# Patient Record
Sex: Male | Born: 1985 | Race: Asian | Hispanic: No | Marital: Married | Smoking: Never smoker
Health system: Southern US, Community
[De-identification: ages and names within clinical notes are randomized; demographics above are authoritative.]

---

## 2020-08-21 DIAGNOSIS — Z0289 Encounter for other administrative examinations: Secondary | ICD-10-CM | POA: Insufficient documentation

## 2020-08-25 ENCOUNTER — Other Ambulatory Visit: Payer: Self-pay

## 2020-08-25 ENCOUNTER — Other Ambulatory Visit (HOSPITAL_COMMUNITY)
Admission: RE | Admit: 2020-08-25 | Discharge: 2020-08-25 | Disposition: A | Discharge status: Home / Self Care | Payer: Medicaid Other | Source: Ambulatory Visit | Attending: Family Medicine | Admitting: Family Medicine

## 2020-08-25 ENCOUNTER — Ambulatory Visit: Payer: Medicaid Other | Admitting: Family Medicine

## 2020-08-25 DIAGNOSIS — Z0289 Encounter for other administrative examinations: Secondary | ICD-10-CM

## 2020-08-25 DIAGNOSIS — R350 Frequency of micturition: Secondary | ICD-10-CM | POA: Insufficient documentation

## 2020-08-25 LAB — POCT URINALYSIS DIP (MANUAL ENTRY)
Bilirubin, UA: NEGATIVE
Blood, UA: NEGATIVE
Glucose, UA: NEGATIVE mg/dL
Ketones, POC UA: NEGATIVE mg/dL
Leukocytes, UA: NEGATIVE
Nitrite, UA: NEGATIVE
Protein Ur, POC: NEGATIVE mg/dL
Spec Grav, UA: 1.025 (ref 1.010–1.025)
Urobilinogen, UA: 2 E.U./dL — AB
pH, UA: 7.5 (ref 5.0–8.0)

## 2020-08-25 LAB — POCT GLYCOSYLATED HEMOGLOBIN (HGB A1C): Hemoglobin A1C: 4.4 % (ref 4.0–5.6)

## 2020-08-25 MED ORDER — ALBENDAZOLE 200 MG PO TABS: 400.0000 mg | ORAL_TABLET | Freq: Once | ORAL | 0 refills | Status: AC

## 2020-08-25 MED ORDER — IVERMECTIN 3 MG PO TABS: 18.0000 mg | ORAL_TABLET | Freq: Every day | ORAL | 0 refills | Status: AC
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
## 2020-08-25 NOTE — Patient Instructions (Addendum)
It was wonderful to see you today.  Please bring ALL of your medications with you to every visit.   Today we talked about: Follow up appointment scheduled  Ivermectin and albendazole given for presumptive parasite treatment  Thank you for choosing [facility].   Please call [phone] with any questions about today's appointment.  Please be sure to schedule follow up at the front  desk before you leave today.   [name], MD  Family Medicine

## 2020-08-25 NOTE — Assessment & Plan Note (Addendum)
-   Plan for follow-up with health department, normal CXR at [facility] base screening  - Ivermectin and albendazole prescribe as presumptive parasite treatment per recommendations by CDC -Follow-up ordered blood tests and urine tests -He has plan follow-up in 1 month to address any additional chronic issues that cannot be addressed today.

## 2020-08-25 NOTE — Assessment & Plan Note (Signed)
He notes that he sometimes has urinary frequency but he thinks this may be related to difficulty finding the bathroom in a new country with a language that he is less familiar with.  He denies dysuria and lower abdominal pain and fevers.  UA shows no evidence of nitrites or leukocytes. -We will discuss further at his follow-up visit in 1 month -No additional lab testing at this time

## 2020-08-26 ENCOUNTER — Encounter: Payer: Self-pay | Admitting: Family Medicine

## 2020-08-26 LAB — LIPID PANEL
Chol/HDL Ratio: 2.9 ratio (ref 0.0–5.0)
Cholesterol, Total: 158 mg/dL (ref 100–199)
HDL: 54 mg/dL (ref >39)
LDL Chol Calc (NIH): 90 mg/dL (ref 0–99)
Triglycerides: 73 mg/dL (ref 0–149)
VLDL Cholesterol Cal: 14 mg/dL (ref 5–40)

## 2020-08-26 LAB — CBC WITH DIFFERENTIAL/PLATELET
Basophils Absolute: 0.1 10*3/uL (ref 0.0–0.2)
Basos: 1 %
EOS (ABSOLUTE): 0.1 10*3/uL (ref 0.0–0.4)
Eos: 3 %
Hematocrit: 45.4 % (ref 37.5–51.0)
Hemoglobin: 15.8 g/dL (ref 13.0–17.7)
Immature Grans (Abs): 0 10*3/uL (ref 0.0–0.1)
Immature Granulocytes: 0 %
Lymphocytes Absolute: 1.8 10*3/uL (ref 0.7–3.1)
Lymphs: 38 %
MCH: 31.2 pg (ref 26.6–33.0)
MCHC: 34.8 g/dL (ref 31.5–35.7)
MCV: 90 fL (ref 79–97)
Monocytes Absolute: 0.3 10*3/uL (ref 0.1–0.9)
Monocytes: 7 %
Neutrophils Absolute: 2.3 10*3/uL (ref 1.4–7.0)
Neutrophils: 51 %
Platelets: 99 10*3/uL — CL (ref 150–450)
RBC: 5.07 x10E6/uL (ref 4.14–5.80)
RDW: 11.4 % — ABNORMAL LOW (ref 11.6–15.4)
WBC: 4.6 10*3/uL (ref 3.4–10.8)

## 2020-08-26 LAB — COMPREHENSIVE METABOLIC PANEL
ALT: 45 IU/L — ABNORMAL HIGH (ref 0–44)
AST: 33 IU/L (ref 0–40)
Albumin/Globulin Ratio: 1.9 (ref 1.2–2.2)
Albumin: 4.4 g/dL (ref 4.0–5.0)
Alkaline Phosphatase: 69 IU/L (ref 44–121)
BUN/Creatinine Ratio: 17 (ref 9–20)
BUN: 14 mg/dL (ref 6–20)
Bilirubin Total: 1.6 mg/dL — ABNORMAL HIGH (ref 0.0–1.2)
CO2: 25 mmol/L (ref 20–29)
Calcium: 9.1 mg/dL (ref 8.7–10.2)
Chloride: 104 mmol/L (ref 96–106)
Creatinine, Ser: 0.81 mg/dL (ref 0.76–1.27)
GFR calc Af Amer: 135 mL/min/{1.73_m2} (ref >59)
GFR calc non Af Amer: 117 mL/min/{1.73_m2} (ref >59)
Globulin, Total: 2.3 g/dL (ref 1.5–4.5)
Glucose: 131 mg/dL — ABNORMAL HIGH (ref 65–99)
Potassium: 3.8 mmol/L (ref 3.5–5.2)
Sodium: 140 mmol/L (ref 134–144)
Total Protein: 6.7 g/dL (ref 6.0–8.5)

## 2020-08-26 LAB — RPR: RPR Ser Ql: NONREACTIVE

## 2020-08-26 LAB — HEPATITIS B SURFACE ANTIBODY, QUANTITATIVE: Hepatitis B Surf Ab Quant: <3.1 m[IU]/mL — ABNORMAL LOW (ref >9.9)

## 2020-08-26 LAB — HEPATITIS B CORE ANTIBODY, TOTAL: Hep B Core Total Ab: POSITIVE — AB

## 2020-08-26 LAB — HIV ANTIBODY (ROUTINE TESTING W REFLEX): HIV Screen 4th Generation wRfx: NONREACTIVE

## 2020-08-26 LAB — VARICELLA ZOSTER ANTIBODY, IGG: Varicella zoster IgG: 589 index (ref >165)

## 2020-08-26 LAB — HCV INTERPRETATION

## 2020-08-26 LAB — TSH: TSH: 1.22 u[IU]/mL (ref 0.450–4.500)

## 2020-08-26 LAB — HEPATITIS B SURFACE ANTIGEN: Hepatitis B Surface Ag: POSITIVE — AB

## 2020-08-26 LAB — HCV AB W REFLEX TO QUANT PCR: HCV Ab: <0.1 s/co ratio (ref 0.0–0.9)

## 2020-08-27 LAB — URINE CYTOLOGY ANCILLARY ONLY
Chlamydia: NEGATIVE
Comment: NEGATIVE
Comment: NORMAL
Neisseria Gonorrhea: NEGATIVE

## 2020-09-01 ENCOUNTER — Telehealth: Payer: Self-pay | Admitting: Family Medicine

## 2020-09-01 ENCOUNTER — Encounter: Payer: Self-pay | Admitting: Family Medicine

## 2020-09-01 DIAGNOSIS — D696 Thrombocytopenia, unspecified: Secondary | ICD-10-CM

## 2020-09-01 DIAGNOSIS — B181 Chronic viral hepatitis B without delta-agent: Secondary | ICD-10-CM

## 2020-09-01 NOTE — Telephone Encounter (Signed)
Using interpretor phone service with Pashto interpretor, attempted to call patient x2 but VM not set up and unable to leave message.  DPR signed in clinic and thus message sent to CM to let patient know about appt scheduled for 09/02/20 at 3:30 pm with Dr Dareen Piano.  Pertinent Results from Initial Refugee visit: - Hep B Core Total Ab positive, Hep B surface Ag positive, Hep B surface Ab < 3.1 = consistent with chronic hepatitis B infection - Thrombocytopenia platelets 99  Plan for patient at next visit: - Discuss diagnosis of chronic hepatitis B- unable to reach patient and he has not been told yet, recommend other members of his household be tested and vaccinated for hep B - Further counseling: not sharing toothbrush or razors, cover open cuts/scratches, clean blood spills with detergent/bleach, use barrier protection during sexual intercourse if partner not vaccinated or naturally immune, advise abstinence from alcohol - For his chronic Hep B further lab work includes- HBV DNA, Hep B E Antigen, Hep B E antibody, Hep A antibody total, PT/INR - For his thrombocytopenia- repeat CBC with peripheral smear - Order and scheduled RUQ ultrasound at patient visit and give location/time of appointment to patient before leaving as he has been unable to be reached by phone - clarify patient phone number and if there is a better number to reach him - referral to ID has been placed, if possible to see if appointment can be scheduled while he is in office so he knows when to follow-up, if not then plan to let him know about appointment at his next follow-up with Dr Homero Fellers or can message through his Case manager  Burley Saver MD

## 2020-09-02 ENCOUNTER — Encounter: Payer: Self-pay | Admitting: Family Medicine

## 2020-09-02 ENCOUNTER — Ambulatory Visit (INDEPENDENT_AMBULATORY_CARE_PROVIDER_SITE_OTHER): Payer: Medicaid Other | Admitting: Family Medicine

## 2020-09-02 ENCOUNTER — Other Ambulatory Visit: Payer: Self-pay

## 2020-09-02 VITALS — BP 124/86 | HR 78 | Resp 16 | Ht 68.0 in | Wt 212.8 lb

## 2020-09-02 DIAGNOSIS — B181 Chronic viral hepatitis B without delta-agent: Secondary | ICD-10-CM | POA: Diagnosis present

## 2020-09-02 DIAGNOSIS — D696 Thrombocytopenia, unspecified: Secondary | ICD-10-CM

## 2020-09-02 NOTE — Progress Notes (Signed)
    SUBJECTIVE:   CHIEF COMPLAINT / HPI:    Chronic hepatitis B: Patient is a very pleasant 34 year old patch to speaking male following up today for his most recent initial refugee visit in which it was noted that the patient has chronic hepatitis B.  At this appointment hepatitis B core total antibody was positive, hepatitis B surface antigen was positive, hepatitis B surface antibody less than 3.1.  Platelets were noted to be 99.  Patient was discussed with me by patient's PCP Dr. [name] as well as by attending Dr. Mikey Bussing.  Concerns and task for this appointment: 1.  Discussed diagnosis of chronic hepatitis B: Patient was educated about hepatitis B being a virus that is very contagious and can potentially damage the liver. 2.  Further counseling on chronic hepatitis B: Patient instructed not to share toothbrushes, razors with others.  Was instructed to cover any open cuts/scratches, to clean any blood spills with detergent/bleach, use barrier protection during sexual intercourse if partner is not vaccinated or naturally immune, also advised to abstain from drinking alcohol. 3.  Contact information/phone number: The patient was instructed that we may try to call him with the help of an interpreter.  His phone number in the chart [phone] was verified as correct with the patient.  The interpreter in the room helped the patient to set up his voicemail.  The patient also remarked that we could reach out to his sponsor Eliott Nine at [phone] should we need to contact someone. 4.  Lab work: Lab work ordered at [employer] visit includes HBV DNA, hep B E antigen, hep B E antibody, hep A antibody total, PT/INR, CBC and peripheral smear 5.  Imaging: Right upper quadrant ultrasound was ordered for patient at this visit and scheduled for him 6.  Referral to infectious disease: Was placed during this office visit by contacting ID office, patient scheduled for follow-up with infectious disease  09/08/2020.  Interpreter will be available. 7.  Patient also scheduled for follow-up visit with his PCP Dr. [name] on 09/29/2020  PERTINENT  PMH / PSH:  Patient Active Problem List   Diagnosis Date Noted  . Chronic hepatitis B (HCC) 09/01/2020  . Thrombocytopenia (HCC) 09/01/2020  . Urinary frequency 08/25/2020  . Encounter for health examination of refugee 08/21/2020     OBJECTIVE:   BP 124/86   Pulse 78   Resp 16   Ht 5\' 8"  (1.727 m)   Wt 212 lb 12.8 oz (96.5 kg)   SpO2 97%   BMI 32.36 kg/m    Physical exam: General: Well-appearing patient, nontoxic-appearing Respiratory: Comfortable work of breathing, speaking complete sentences  ASSESSMENT/PLAN:   Thrombocytopenia (HCC) -Follow-up with CBC -Peripheral smear ordered  Chronic hepatitis B (HCC) 1.  Additional labs ordered to further evaluate patient's chronic hepatitis B 2.  Patient was educated on what hepatitis B is, how it is spread, and how to prevent spread 3.  Patient instructed to have his roommate/housemates evaluated and tested for hepatitis B 4.  Imaging of right upper quadrant ultrasound ordered for patient 5.  Infectious disease follow-up appointment made 6.  Patient to follow-up with PCP September 29, 2020     October 01, 2020, DO [facility]

## 2020-09-02 NOTE — Assessment & Plan Note (Signed)
-  Follow-up with CBC -Peripheral smear ordered

## 2020-09-02 NOTE — Assessment & Plan Note (Signed)
1.  Additional labs ordered to further evaluate patient's chronic hepatitis B 2.  Patient was educated on what hepatitis B is, how it is spread, and how to prevent spread 3.  Patient instructed to have his roommate/housemates evaluated and tested for hepatitis B 4.  Imaging of right upper quadrant ultrasound ordered for patient 5.  Infectious disease follow-up appointment made 6.  Patient to follow-up with PCP September 29, 2020

## 2020-09-02 NOTE — Patient Instructions (Addendum)
Thank you for coming in to see [place] today! Please see below to review our plan for today's visit:  1. Do not share not toothbrush or razors, cover open cuts/scratches, clean blood spills with detergent/bleach, use barrier protection during sexual intercourse if partner not vaccinated or naturally immune, advise abstinence from alcohol. 2. We are sending you to get an Ultrasound of your Abdomen 3. Please follow up with Infectious Disease on December 21 at 9:30am - [facility] [address].    Please call the clinic at [phone] if your symptoms worsen or you have any concerns. It was our pleasure to serve you!     Dr. Peggyann Shoals [facility]

## 2020-09-03 LAB — HEPATITIS B DNA, ULTRAQUANTITATIVE, PCR

## 2020-09-03 LAB — HEPATITIS B E ANTIBODY: Hep B E Ab: NEGATIVE

## 2020-09-03 LAB — HEPATITIS B E ANTIGEN: Hep B E Ag: POSITIVE — AB

## 2020-09-03 LAB — CBC
Hematocrit: 45.5 % (ref 37.5–51.0)
Hemoglobin: 16 g/dL (ref 13.0–17.7)
MCH: 31.8 pg (ref 26.6–33.0)
MCHC: 35.2 g/dL (ref 31.5–35.7)
MCV: 91 fL (ref 79–97)
Platelets: 109 10*3/uL — ABNORMAL LOW (ref 150–450)
RBC: 5.03 x10E6/uL (ref 4.14–5.80)
RDW: 11.8 % (ref 11.6–15.4)
WBC: 6 10*3/uL (ref 3.4–10.8)

## 2020-09-03 LAB — PROTIME-INR
INR: 1.2 (ref 0.9–1.2)
Prothrombin Time: 12.3 s — ABNORMAL HIGH (ref 9.1–12.0)

## 2020-09-03 LAB — HBV REAL-TIME PCR, QUANT
HBV AS IU/ML: 12600000 IU/mL
LOG10 HBV AS IU/ML: 7.1 log10 IU/mL

## 2020-09-03 LAB — HEPATITIS A ANTIBODY, TOTAL: hep A Total Ab: POSITIVE — AB

## 2020-09-04 ENCOUNTER — Telehealth: Payer: Self-pay | Admitting: *Deleted

## 2020-09-04 NOTE — Telephone Encounter (Signed)
-----   Message from Billey Co, MD sent at 09/01/2020 11:58 AM EST ----- Regarding: ID referral Hi team,  I saw the message about Jazmin being out, I placed a referral to ID today for this patient, he has an appointment with Dr Dareen Piano tomorrow to follow up chronic hep B. Just wanted to make y'all aware of referral being placed so we can get him scheduled for follow up with ID. If possible would be nice to have appt scheduled before he leaves tomorrow just so we can tell him date/time since I have been unable to reach him by phone, but no worries if not I can send this information to his case manager.  Thanks for all you do! [employer]

## 2020-09-04 NOTE — Telephone Encounter (Signed)
THis appointment has been scheduled.Kaylia Winborne Zimmerman Rumple, CMA  

## 2020-09-04 NOTE — Telephone Encounter (Signed)
Awesome! Thank you!

## 2020-09-05 LAB — PATHOLOGIST SMEAR REVIEW
Basophils Absolute: 0.1 10*3/uL (ref 0.0–0.2)
Basos: 1 %
EOS (ABSOLUTE): 0.1 10*3/uL (ref 0.0–0.4)
Eos: 2 %
Hematocrit: 45.7 % (ref 37.5–51.0)
Hemoglobin: 15.9 g/dL (ref 13.0–17.7)
Immature Grans (Abs): 0 10*3/uL (ref 0.0–0.1)
Immature Granulocytes: 0 %
Lymphocytes Absolute: 1.9 10*3/uL (ref 0.7–3.1)
Lymphs: 34 %
MCH: 31.2 pg (ref 26.6–33.0)
MCHC: 34.8 g/dL (ref 31.5–35.7)
MCV: 90 fL (ref 79–97)
Monocytes Absolute: 0.4 10*3/uL (ref 0.1–0.9)
Monocytes: 7 %
Neutrophils Absolute: 3.2 10*3/uL (ref 1.4–7.0)
Neutrophils: 56 %
Platelets: 110 10*3/uL — ABNORMAL LOW (ref 150–450)
RBC: 5.1 x10E6/uL (ref 4.14–5.80)
RDW: 11.5 % — ABNORMAL LOW (ref 11.6–15.4)
WBC: 5.8 10*3/uL (ref 3.4–10.8)

## 2020-09-08 ENCOUNTER — Ambulatory Visit: Payer: Medicaid Other | Admitting: Internal Medicine

## 2020-09-25 ENCOUNTER — Other Ambulatory Visit: Payer: Medicaid Other

## 2020-09-29 ENCOUNTER — Other Ambulatory Visit: Payer: Self-pay

## 2020-09-29 ENCOUNTER — Ambulatory Visit (INDEPENDENT_AMBULATORY_CARE_PROVIDER_SITE_OTHER): Payer: Medicaid Other | Admitting: Internal Medicine

## 2020-09-29 ENCOUNTER — Ambulatory Visit: Payer: Medicaid Other | Admitting: Family Medicine

## 2020-09-29 ENCOUNTER — Encounter: Payer: Self-pay | Admitting: Family Medicine

## 2020-09-29 VITALS — Ht 68.0 in | Wt 212.0 lb

## 2020-09-29 DIAGNOSIS — D6949 Other primary thrombocytopenia: Secondary | ICD-10-CM

## 2020-09-29 DIAGNOSIS — B181 Chronic viral hepatitis B without delta-agent: Secondary | ICD-10-CM | POA: Diagnosis not present

## 2020-09-29 DIAGNOSIS — Z205 Contact with and (suspected) exposure to viral hepatitis: Secondary | ICD-10-CM | POA: Diagnosis not present

## 2020-09-29 DIAGNOSIS — K219 Gastro-esophageal reflux disease without esophagitis: Secondary | ICD-10-CM

## 2020-09-29 DIAGNOSIS — D696 Thrombocytopenia, unspecified: Secondary | ICD-10-CM | POA: Diagnosis not present

## 2020-09-29 MED ORDER — PANTOPRAZOLE SODIUM 20 MG PO TBEC: 20.0000 mg | DELAYED_RELEASE_TABLET | Freq: Every day | ORAL | 3 refills | Status: DC

## 2020-09-29 MED ORDER — FAMOTIDINE 20 MG PO TABS: 20.0000 mg | ORAL_TABLET | Freq: Two times a day (BID) | ORAL | 1 refills | Status: AC

## 2020-09-29 NOTE — Progress Notes (Signed)
[facility]  Reason for Consult:hepatitis b Referring Provider: [name], md    Patient Active Problem List   Diagnosis Date Noted  . Chronic hepatitis B (HCC) 09/01/2020  . Thrombocytopenia (HCC) 09/01/2020  . Urinary frequency 08/25/2020  . Encounter for health examination of refugee 08/21/2020      HPI: Johnathan Harris is a 35 y.o. male with GERD referred here for chronic hep B tx, from his pcp  Interpreter present   Chronic hep B history: He remembers his eyes turning yellow 2 years ago when he was in [place]. He remembers at that time his kids got some kind of vaccine. He was given some kind of medication He never new he has chronic hepatitis b Review of his labs; hep c ab and hep a ab also positive There was an ultrasound of his liver He doesn't know anyone he has lived with that has chronic hepatitis b   Extra GI manifestation & Cirrhosis s/s: No dm2/htn/hlp Patient feels he is in good health No chronic joint pain, fatigue, weight loss, fatigue No hx abd girth increase/paracentesis, vomiting up blood/black-bloody stool Weight increasing No rash  Blood borne pathogen risk factors: Denies ivdu, tatoos No hx blood transfusion Monogamous relationship with wife for 15 years  Social: Patient immigrated to the [place] in 5 months; he plans to stay in [place] for a long time. He came from [place] Patient is not here with his family. His wife is currently in [place]; he has 6 kids No smoking, drinking, street drugs   He needs a letter for being off work today He would like a prescription for her protonix   Review of Systems: ROS Other ROS negative      PMH: #gerd  Fam hx: No history liver cancer   Social History   Tobacco Use  . Smoking status: Never Smoker  . Smokeless tobacco: Never Used    No family history on file.  No Known Allergies  OBJECTIVE: There were no vitals filed for this  visit. There is no height or weight on file to calculate BMI.   Physical Exam Well appearing, obese, no distress Heent: per, conj clear, eomi, oropharynx clear Neck supple cv rrr no mrg Lungs clear abd obese/soft-nt Ext no edema Skin no rash msk no back/cva tenderness Neuro cn2-12 intact; strength/reflex intact; nonfocal Psych alert/oriented  Lab: 09/02/20 cbc 6/16/110; cr 0.8; lft 33/45/69/1.6; alb 4.4  Microbiology:  Serology: 08/2020 rpr nr, hiv nr, gc/chlam negative 08/2020 hep B sAg positive, hep b eAg positive, hep B eAb negative, hep B sAb negative, hep B cAb positive 08/2020 hep A ab positive, hep C ab positive  Imaging: none  Assessment/plan: 35 yo [place] immigrant of 5 months prior to this 09/2020 visit referred  Here for chronic hep b evaluation and treatment  #hep b; e-ag positive -I suspect this is more likely perinatal transmission but certainly sexual promiscuity is a possibility. I didn't dwell into too much with latter risk factor out of hesitation of cultural difference. He also has hepatitis c exposure as well. hiv is negative.  -I am unclear about duration of hep b... there is mild thrombocytopenia but no other sign of cirrhosis otherwise on exam/history. Could be direct hepatitis effect vs other such as primary hematologic process, or given immigrant from [place] could be other granulomatous process. He doesn't drink alcohol. protonix should be low risk for inducing thrombocytopenia -only mild lft elevation with ALT  45. vl high. Would consider this immune tolerance. He is young. No family hx HCC. dont' think he needs treatment at this time per guideline  -repeat hepatitis b full serology in 3 months -liver ultrasound complete -work letter  -discuss healthy lifestyle and avoid risk factors contributing to hepatitis/cirrhosis such as tylenol use/etoh/weight-dm   #other hepatitis -will check hep c pcr as well  #gerd -provide new rx  protonix  #thrombocytopenia -repeat testing 3 months; see ddx above -if persistently low and no evidence cirrhosis will refer to hematology -based on liver ultrasound (and will consider abd/pelv ct for lymphadenopathy/granulomatous change in liver/spleen) will consider sendign serology for other chronic granulomatous infection as well    Problem List Items Addressed This Visit   None   Visit Diagnoses    Chronic viral hepatitis B without delta agent and without coma (HCC)    -  Primary   Relevant Orders   Protime-INR ( [facility] ONLY)   CBC   Lipid panel   AFP tumor marker   Comprehensive metabolic panel   Hepatitis delta virus antigen   Hepatitis delta antibody   Hepatitis C RNA quantitative ([facility])   Hepatitis B e antibody   Hepatitis B e antigen   Hepatitis B surface antibody,quantitative   Hepatitis B Surface AntiGEN   Hepatitis B DNA, ultraquantitative, PCR   US Abdomen Complete   Exposure to hepatitis C       Gastroesophageal reflux disease without esophagitis       Relevant Medications   pantoprazole (PROTONIX) 20 MG tablet       Torell Kellen does not currently have medications on file.     Follow-up: No follow-ups on file.  Raymondo Band, MD [facility] -- -- pager   [phone] cell 09/29/2020, 8:45 AM

## 2020-09-29 NOTE — Assessment & Plan Note (Signed)
Labs consistent with ongoing hepatitis B infection.  Liver ultrasound shows no evidence of masses. -No medication needed at this time -We discussed how he has the potential to clear this infection on his own without medication because he is young and healthy. -Continue to follow with infectious disease

## 2020-09-29 NOTE — Patient Instructions (Addendum)
Hepatitis B: You do not need any specific treatment at this time.  Please follow-up with infectious disease for your next appointment to see if your body has gotten rid of the infection.  Stomach pain: Please do not take pantoprazole or omeprazole.  If you have stomach pain, please use famotidine.  I have sent a prescription of this medicine to your pharmacy.  Please come back to clinic in 1 month and we will test you for an infection called H. pylori which can lead to stomach discomfort.  Please avoid using famotidine 2 days before your appointment.  Polio vaccine: Please follow-up with the health department to discuss a polio vaccine.    ~? ~? ???? ?  ?? .  ~?   ~ ??    ?  ? ~? ? ? ?    ?~ ?  ? ?.   ? :  ~? pantoprazole ? omeprazole  . ~   ?    ~? famotidine ~.   ?      ??? .  ~?  ? ? ~? ? ~??~    ?    H. pylori    ?~  ? ~? ?  ?  ~?  ~?? .  ~?    ?  ??   ???? ~ ? ?? ~?.   ? ~?:  ~?  ? ~?  ?     ? ?  ? ~?.

## 2020-09-29 NOTE — Assessment & Plan Note (Signed)
-  Continue to monitor, along with hepatitis B infection.  If this does not improve with his hepatitis B infection, a referral to hematology would be beneficial.

## 2020-09-29 NOTE — Patient Instructions (Addendum)
You likely have chronic hepatitis B  This will put you at risk for liver cancer (can develop anytime), and cirrhosis (or failing of your liver) after 20-30 years  You also have exposure to chronic hepatitis C   There will be more blood tests in the next several months. We can do them 6 months from now  You'll also need to have periodic liver ultrasound to make sure you don't develop cancer  If your family members dont' have hepatitis B (as you confirm) I recommend they get hepatitis b vaccine   To avoid other risk for getting poor liver health Avoid alcohol Get regular exercise Eat good balanced diet   Please follow up with [place] in 3 months; do blood tests 2 weeks before that to follow up on your hepatitis b and C

## 2020-09-29 NOTE — Assessment & Plan Note (Signed)
Symptoms are consistent with GERD.  He does have significant risk for H. pylori.  We will forego the breath test today based on his recent use of omeprazole.  We will plan for a breath test when he returns to clinic in 1 month. -Do not use pantoprazole or omeprazole -Use famotidine 20 mg twice daily as needed for the next month.  Discontinue famotidine 2 days prior to next visit. -Plan for urea breath test next visit.

## 2020-09-29 NOTE — Progress Notes (Signed)
    SUBJECTIVE:   CHIEF COMPLAINT / HPI:   Hepatitis B infection, active Johnathan Harris was seen by infectious disease earlier today.  He was told that his labs are consistent with an ongoing hepatitis B infection but no treatment is necessary at this time.  He did have questions about how this could be possible-how he could be sick yet not require treatment.  GERD He reports occasional stomach discomfort which improves with PPI.  While he was in [place], he was prescribed pantoprazole by a physician which significantly improved his abdominal pain.  He notes that he did have a recurrence of his abdominal pain 2 days ago and took over-the-counter omeprazole which he found helpful.  He was also given pantoprazole earlier today by his infectious disease doctor.  PERTINENT  PMH / PSH: Ongoing hepatitis B infection as above  OBJECTIVE:   BP 118/74   Pulse 83   Ht 5\' 8"  (1.727 m)   Wt 215 lb 6.4 oz (97.7 kg)   SpO2 98%   BMI 32.75 kg/m    General: Alert and cooperative and appears to be in no acute distress Cardio: Normal S1 and S2, no S3 or S4. Rhythm is regular. No murmurs or rubs.   Pulm: Clear to auscultation bilaterally, no crackles, wheezing, or diminished breath sounds. Normal respiratory effort Abdomen: Bowel sounds normal. Abdomen soft and non-tender.  No palpable hepatosplenomegaly.   ASSESSMENT/PLAN:   Chronic hepatitis B (Johnathan Harris) Labs consistent with ongoing hepatitis B infection.  Liver ultrasound shows no evidence of masses. -No medication needed at this time -We discussed how he has the potential to clear this infection on his own without medication because he is young and healthy. -Continue to follow with infectious disease  Thrombocytopenia (Johnathan Harris) -Continue to monitor, along with hepatitis B infection.  If this does not improve with his hepatitis B infection, a referral to hematology would be beneficial.  Chronic GERD Symptoms are consistent with GERD.  He does have  significant risk for H. pylori.  We will forego the breath test today based on his recent use of omeprazole.  We will plan for a breath test when he returns to clinic in 1 month. -Do not use pantoprazole or omeprazole -Use famotidine 20 mg twice daily as needed for the next month.  Discontinue famotidine 2 days prior to next visit. -Plan for urea breath test next visit.   Healthcare maintenance -Due for polio vaccination, encouraged to follow-up with the health department.  , MD [facility]

## 2020-10-09 ENCOUNTER — Ambulatory Visit: Payer: Medicaid Other | Admitting: Family Medicine

## 2020-10-12 ENCOUNTER — Ambulatory Visit
Admission: RE | Admit: 2020-10-12 | Discharge: 2020-10-12 | Disposition: A | Discharge status: Home / Self Care | Payer: Medicaid Other | Source: Ambulatory Visit | Attending: Family Medicine | Admitting: Family Medicine

## 2020-10-12 DIAGNOSIS — B181 Chronic viral hepatitis B without delta-agent: Secondary | ICD-10-CM

## 2020-11-02 ENCOUNTER — Ambulatory Visit: Payer: Medicaid Other | Admitting: Family Medicine

## 2020-12-10 ENCOUNTER — Other Ambulatory Visit: Payer: Self-pay

## 2020-12-10 ENCOUNTER — Ambulatory Visit: Payer: Medicaid Other | Admitting: Family Medicine

## 2020-12-10 VITALS — BP 112/70 | HR 77 | Ht 68.0 in | Wt 208.5 lb

## 2020-12-10 DIAGNOSIS — K219 Gastro-esophageal reflux disease without esophagitis: Secondary | ICD-10-CM | POA: Diagnosis present

## 2020-12-10 NOTE — Progress Notes (Signed)
    SUBJECTIVE:   CHIEF COMPLAINT / HPI:   GERD Johnathan Harris was last seen in clinic about 2 months ago at which time he was told to use famotidine for stomach upset and return to clinic for an H. pylori breath test.  Since the last visit, he has been using famotidine which is controlled his GERD symptoms well.  He reports that he has not been taking famotidine for the past 2 days per my former instructions.  He has also been off of tonics.  PERTINENT  PMH / PSH: GERD, refugee  OBJECTIVE:   BP 112/70   Pulse 77   Ht 5\' 8"  (1.727 m)   Wt 208 lb 8 oz (94.6 kg)   SpO2 96%   BMI 31.70 kg/m    General: Alert and cooperative and appears to be in no acute distress Cardio: Normal S1 and S2, no S3 or S4. Rhythm is regular. No murmurs or rubs.   Pulm: Clear to auscultation bilaterally, no crackles, wheezing, or diminished breath sounds. Normal respiratory effort Abdomen: Bowel sounds normal. Abdomen soft and non-tender.  Extremities: No peripheral edema. Warm/ well perfused.  Strong radial pulses. Neuro: Cranial nerves grossly intact  ASSESSMENT/PLAN:   Chronic GERD -Follow-up urea breath test -Continue famotidine or Protonix as needed     , MD [facility]

## 2020-12-10 NOTE — Assessment & Plan Note (Signed)
-  Follow-up urea breath test -Continue famotidine or Protonix as needed

## 2020-12-11 ENCOUNTER — Other Ambulatory Visit: Payer: Medicaid Other

## 2020-12-11 DIAGNOSIS — B181 Chronic viral hepatitis B without delta-agent: Secondary | ICD-10-CM

## 2020-12-12 LAB — H. PYLORI BREATH TEST: H pylori Breath Test: POSITIVE — AB

## 2020-12-15 ENCOUNTER — Other Ambulatory Visit: Payer: Self-pay | Admitting: Family Medicine

## 2020-12-15 MED ORDER — PANTOPRAZOLE SODIUM 40 MG PO TBEC: 40.0000 mg | DELAYED_RELEASE_TABLET | Freq: Two times a day (BID) | ORAL | 0 refills | Status: DC

## 2020-12-15 MED ORDER — PYLERA 140-125-125 MG PO CAPS: 3.0000 | ORAL_CAPSULE | Freq: Three times a day (TID) | ORAL | 0 refills | Status: DC

## 2020-12-15 NOTE — Progress Notes (Signed)
Johnathan Harris and his sponsor, [name], were called individually to discuss his recent H. pylori results.  They were informed that he is positive for H. pylori and should be treated.  Quadruple therapy (including pantoprazole, bismuth, tetracycline and metronidazole) was sent to his pharmacy.  He was encouraged not to start taking the medication until we were able to see him in the clinic.  He was scheduled for an appointment on 4/13.

## 2020-12-17 LAB — CBC
HCT: 46.9 % (ref 38.5–50.0)
Hemoglobin: 15.7 g/dL (ref 13.2–17.1)
MCH: 30 pg (ref 27.0–33.0)
MCHC: 33.5 g/dL (ref 32.0–36.0)
MCV: 89.5 fL (ref 80.0–100.0)
MPV: 13.3 fL — ABNORMAL HIGH (ref 7.5–12.5)
Platelets: 106 10*3/uL — ABNORMAL LOW (ref 140–400)
RBC: 5.24 10*6/uL (ref 4.20–5.80)
RDW: 11.3 % (ref 11.0–15.0)
WBC: 4.2 10*3/uL (ref 3.8–10.8)

## 2020-12-17 LAB — COMPREHENSIVE METABOLIC PANEL
AG Ratio: 1.8 (calc) (ref 1.0–2.5)
ALT: 30 U/L (ref 9–46)
AST: 26 U/L (ref 10–40)
Albumin: 4.3 g/dL (ref 3.6–5.1)
Alkaline phosphatase (APISO): 70 U/L (ref 36–130)
BUN: 16 mg/dL (ref 7–25)
CO2: 24 mmol/L (ref 20–32)
Calcium: 9 mg/dL (ref 8.6–10.3)
Chloride: 106 mmol/L (ref 98–110)
Creat: 0.83 mg/dL (ref 0.60–1.35)
Globulin: 2.4 g/dL (calc) (ref 1.9–3.7)
Glucose, Bld: 93 mg/dL (ref 65–99)
Potassium: 4.3 mmol/L (ref 3.5–5.3)
Sodium: 140 mmol/L (ref 135–146)
Total Bilirubin: 2.1 mg/dL — ABNORMAL HIGH (ref 0.2–1.2)
Total Protein: 6.7 g/dL (ref 6.1–8.1)

## 2020-12-17 LAB — LIPID PANEL
Cholesterol: 149 mg/dL (ref <200)
HDL: 53 mg/dL (ref >=40)
LDL Cholesterol (Calc): 83 mg/dL (calc)
Non-HDL Cholesterol (Calc): 96 mg/dL (calc) (ref <130)
Total CHOL/HDL Ratio: 2.8 (calc) (ref <5.0)
Triglycerides: 54 mg/dL (ref <150)

## 2020-12-17 LAB — HEPATITIS C RNA QUANTITATIVE
HCV Quantitative Log: <1.18 log IU/mL
HCV RNA, PCR, QN: <15 IU/mL

## 2020-12-17 LAB — HEPATITIS B E ANTIGEN: Hep B E Ag: REACTIVE — AB

## 2020-12-17 LAB — HEPATITIS B DNA, ULTRAQUANTITATIVE, PCR
Hepatitis B DNA (Calc): 5.92 Log IU/mL — ABNORMAL HIGH
Hepatitis B DNA: 836000 IU/mL — ABNORMAL HIGH

## 2020-12-17 LAB — HEPATITIS DELTA VIRUS ANTIGEN: Hepatitis D Antigen: NOT DETECTED

## 2020-12-17 LAB — HEPATITIS B SURFACE ANTIBODY, QUANTITATIVE: Hep B S AB Quant (Post): <5 m[IU]/mL — ABNORMAL LOW (ref >=10)

## 2020-12-17 LAB — AFP TUMOR MARKER: AFP-Tumor Marker: 2.6 ng/mL (ref <6.1)

## 2020-12-17 LAB — HEPATITIS B E ANTIBODY: Hep B E Ab: NONREACTIVE

## 2020-12-17 LAB — HEPATITIS DELTA ANTIBODY: Hepatitis D Ab, Total: NEGATIVE

## 2020-12-17 LAB — HEPATITIS B SURFACE ANTIGEN: Hepatitis B Surface Ag: REACTIVE — AB

## 2020-12-25 ENCOUNTER — Encounter: Payer: Medicaid Other | Admitting: Internal Medicine

## 2020-12-25 ENCOUNTER — Other Ambulatory Visit: Payer: Self-pay

## 2020-12-25 ENCOUNTER — Encounter: Payer: Self-pay | Admitting: Internal Medicine

## 2020-12-25 ENCOUNTER — Ambulatory Visit (INDEPENDENT_AMBULATORY_CARE_PROVIDER_SITE_OTHER): Payer: Medicaid Other | Admitting: Internal Medicine

## 2020-12-25 VITALS — BP 120/82 | HR 69 | Temp 98.3°F | Wt 210.0 lb

## 2020-12-25 DIAGNOSIS — B181 Chronic viral hepatitis B without delta-agent: Secondary | ICD-10-CM

## 2020-12-25 DIAGNOSIS — D696 Thrombocytopenia, unspecified: Secondary | ICD-10-CM

## 2020-12-25 DIAGNOSIS — A048 Other specified bacterial intestinal infections: Secondary | ICD-10-CM

## 2020-12-25 NOTE — Patient Instructions (Signed)
Your chronic liver disease due to hepatitis b doesn't need treatment right now  We will need to see what your liver health is (elastography is scheduled and will help [place] answer this question)  You also have low platelet, which could be due to infection or related to your h-pylori or hepatitis or liver disease. So elastography above, and abdominal ct will help determine this   Please discuss with your PCP to refer you to hematology to evaluate for your low platelet as well   For your hep b, you'll need to see [place] in 06/2021. We are checking repeat blood tests each visit to see when we need to treat you (do blood tests in 05/2021)

## 2020-12-25 NOTE — Progress Notes (Signed)
[facility]  Reason for Consult:hepatitis b Referring Provider: [name], md    Patient Active Problem List   Diagnosis Date Noted  . Chronic GERD 09/29/2020  . Chronic hepatitis B (HCC) 09/01/2020  . Thrombocytopenia (HCC) 09/01/2020      HPI: Johnathan Harris is a 35 y.o. male with GERD referred here for chronic hep B tx, from his pcp  12/25/20 id f/u Interpreter present  Reviewed hepatitis labs with patient He saw his pcp and was tested for h-pylori which is positive. He'll follow up with his pcp to initiate treatment. He has epigastric discomfort so was tested.  No family history of stomach cancer.  No family history of advance liver disease, liver cancer, liver cirrhosis  Patient is without history of diabetes  Social -- married; children; no smoking; no etoh; no street drugs. Patient immigrated here from [place]    Background from 09/2020 visit -------------- Chronic hep B history: He remembers his eyes turning yellow 2 years ago when he was in [place]. He remembers at that time his kids got some kind of vaccine. He was given some kind of medication He never new he has chronic hepatitis b Review of his labs; hep c ab and hep a ab also positive There was an ultrasound of his liver He doesn't know anyone he has lived with that has chronic hepatitis b   Extra GI manifestation & Cirrhosis s/s: No dm2/htn/hlp Patient feels he is in good health No chronic joint pain, fatigue, weight loss, fatigue No hx abd girth increase/paracentesis, vomiting up blood/black-bloody stool Weight increasing No rash  Blood borne pathogen risk factors: Denies ivdu, tatoos No hx blood transfusion Monogamous relationship with wife for 15 years  Social: Patient immigrated to the [place] in 5 months; he plans to stay in [place] for a long time. He came from [place] Patient is not here with his family. His wife is currently in [place];  he has 6 kids No smoking, drinking, street drugs   He needs a letter for being off work today He would like a prescription for her protonix   Review of Systems: ROS All other ROS negative      PMH: #gerd #chronic hep b  Fam hx: No history liver cancer   Social History   Tobacco Use  . Smoking status: Never Smoker  . Smokeless tobacco: Never Used  Substance Use Topics  . Alcohol use: Never  . Drug use: Never    No family history on file.  No Known Allergies  OBJECTIVE: Vitals:   12/25/20 0902  BP: 120/82  Pulse: 69  Temp: 98.3 F (36.8 C)  TempSrc: Oral  SpO2: 97%  Weight: 95.3 kg   Body mass index is 31.93 kg/m.   Physical Exam General/constitutional: no distress, pleasant HEENT: Normocephalic, PER, Conj Clear, EOMI, Oropharynx clear Neck supple CV: rrr no mrg Lungs: clear to auscultation, normal respiratory effort Abd: Soft, Nontender Ext: no edema Skin: No Rash Neuro: nonfocal MSK: no peripheral joint swelling/tenderness/warmth; back spines nontender     Lab: 12/11/20 alt 30; bilirubin 2.1; cbc 4/16/106 09/02/20 cbc 6/16/110; cr 0.8; lft 33/45/69/1.6; alb 4.4  Microbiology:  Serology: 3/25 hep B sAg, eAg positive; sAb, eAb negative; hep b dna 836000 3/25 hep c ab negative 3/24 h-pylori breath test positive 08/2020 rpr nr, hiv nr, gc/chlam negative 08/2020 hep B sAg positive, hep b eAg positive, hep B eAb negative, hep B sAb negative, hep B  cAb positive 08/2020 hep A ab positive, hep C ab positive  Imaging: none  Assessment/plan: 35 yo [place] immigrant of 5 months prior to this 09/2020 visit referred  Here for chronic hep b evaluation and treatment  #hep b; e-ag positive Family/children tested and per patient were negative Hep c negative His liver function is showing improvement No family hx liver cancer/cirrhosis Will evaluate liver health and repeat lab test in 3-6 months   -repeat hepatitis b full serology in 3-6 months  then follow up -liver ultrasound elastography -abd-pelv ct with contrast -discuss proper hygiene, need for healthy lifestyle/diet, and need for avoiding alcohol -will need yearly liver u/s for screening   #thrombocytopenia persistant moderate decrease baseline seems to be 100s ddx hepatitis, liver disease, hpylori associated ITP vs others  -liver elastography and abd ct to assess for nodules/splenomegaly (granulomatous reticuloendothelial chronic infection-bone marrow involvement) -f/u pcp for h-pylori treatment   #h-pylori See above   Problem List Items Addressed This Visit      Digestive   Chronic hepatitis B (HCC)   Relevant Orders   CT ABDOMEN PELVIS W CONTRAST   [place] ELASTOGRAPHY LIVER   Hepatitis B e antibody   Hepatitis B e antigen   Hepatitis B surface antigen   Hepatitis B DNA, ultraquantitative, PCR   Hepatitis B surface antibody,qualitative   CBC w/Diff   Comprehensive metabolic panel     Other   Thrombocytopenia (HCC)   Relevant Orders   CT ABDOMEN PELVIS W CONTRAST   [place] ELASTOGRAPHY LIVER    Other Visit Diagnoses    H. pylori infection    -  Primary       I am having Johnathan Harris maintain his famotidine, Pylera, and pantoprazole.     Follow-up: Return in about 6 months (around 06/26/2021).  Raymondo Band, MD [facility] -- -- pager   [phone] cell 12/25/2020, 9:38 AM

## 2020-12-30 ENCOUNTER — Ambulatory Visit: Payer: Medicaid Other | Admitting: Family Medicine

## 2020-12-30 ENCOUNTER — Telehealth: Payer: Self-pay

## 2020-12-30 ENCOUNTER — Other Ambulatory Visit: Payer: Self-pay

## 2020-12-30 DIAGNOSIS — A048 Other specified bacterial intestinal infections: Secondary | ICD-10-CM | POA: Diagnosis not present

## 2020-12-30 MED ORDER — PANTOPRAZOLE SODIUM 40 MG PO TBEC
40.0000 mg | DELAYED_RELEASE_TABLET | Freq: Two times a day (BID) | ORAL | 0 refills | Status: DC
Start: 2020-12-30 — End: 2021-03-15

## 2020-12-30 MED ORDER — PYLERA 140-125-125 MG PO CAPS: 3.0000 | ORAL_CAPSULE | Freq: Three times a day (TID) | ORAL | 0 refills | Status: DC

## 2020-12-30 MED ORDER — PANTOPRAZOLE SODIUM 40 MG PO TBEC: 40.0000 mg | DELAYED_RELEASE_TABLET | Freq: Two times a day (BID) | ORAL | 0 refills | Status: DC

## 2020-12-30 NOTE — Patient Instructions (Addendum)
H. pylori infection: Please go and pick up the medicine for your H. pylori infection.  I will call you at the time scheduled below and we will talk about the appropriate way to take this medication.  It is okay if you do not start the medication until after Ramadan.    H. pylori ?~:  ~? ?     H. pylori ?~   .      ? ?~   ~? ? ~?  ?   ?  ? ? ?  ? ???.    ~   Z?  ?? ?  ?  ~?.

## 2020-12-30 NOTE — Telephone Encounter (Signed)
Sponsor calls nurse line reporting pharmacy can not fill prescriptions due to PCP not being a medicaid provider. Sponsor is asking me to resend under someone who is and to the [employer] on [address]. Will send under Dr. Carlena Bjornstad name.

## 2020-12-30 NOTE — Assessment & Plan Note (Addendum)
There is no urgent need to start H. pylori treatment at this time.  We will plan to start treatment following the completion of Ramadan. -H. pylori treatment resent to pharmacy -Phone call visit to set up for 4/27 to discuss the treatment regimen once he has the medication -The plan was discussed with Johnathan Harris and his sponsor, [name] who plans to be involved in the phone call on 4/27.

## 2020-12-30 NOTE — Progress Notes (Signed)
    SUBJECTIVE:   CHIEF COMPLAINT / HPI:   H. pylori infection The main purpose of this visit was to go over the appropriate medication for his H. pylori infection. He has not yet picked up his medication and does not have any medication with him today, he believes the prescription may have been canceled.  He currently fasts all day during the day due to Ramadan.  Ramadan will be over on 5/4 followed by several days of feasting.  He would prefer to start the medication after that.  PERTINENT  PMH / PSH: Chronic hepatitis B, H. pylori infection  OBJECTIVE:   BP 127/87   Pulse 61   Ht 5\' 8"  (1.727 m)   Wt 208 lb 6 oz (94.5 kg)   SpO2 99%   BMI 31.68 kg/m    General: Seated comfortably in his chair in the exam room.  No acute distress. Respiratory: Breathing comfortably on room air.  No respiratory distress Extremities: Warm, dry  ASSESSMENT/PLAN:   H. pylori infection There is no urgent need to start H. pylori treatment at this time.  We will plan to start treatment following the completion of Ramadan. -H. pylori treatment resent to pharmacy -Phone call visit to set up for 4/27 to discuss the treatment regimen once he has the medication -The plan was discussed with Mr. Hoon and his sponsor, Dallas Schimke who plans to be involved in the phone call on 4/27.   A [place] interpreter was used for the duration of the visit.  5/27, MD [facility]

## 2020-12-31 ENCOUNTER — Ambulatory Visit (HOSPITAL_COMMUNITY)
Admission: RE | Admit: 2020-12-31 | Discharge: 2020-12-31 | Disposition: A | Discharge status: Home / Self Care | Payer: Medicaid Other | Source: Ambulatory Visit | Attending: Internal Medicine | Admitting: Internal Medicine

## 2020-12-31 DIAGNOSIS — B181 Chronic viral hepatitis B without delta-agent: Secondary | ICD-10-CM | POA: Insufficient documentation

## 2020-12-31 DIAGNOSIS — D696 Thrombocytopenia, unspecified: Secondary | ICD-10-CM

## 2020-12-31 MED ORDER — IOHEXOL 300 MG/ML  SOLN
100.0000 mL | Freq: Once | INTRAMUSCULAR | Status: AC | PRN
  Administered 2020-12-31: 100 mL via INTRAVENOUS

## 2021-01-04 ENCOUNTER — Ambulatory Visit (HOSPITAL_COMMUNITY): Payer: Medicaid Other

## 2021-01-13 ENCOUNTER — Telehealth (INDEPENDENT_AMBULATORY_CARE_PROVIDER_SITE_OTHER): Payer: Medicaid Other | Admitting: Family Medicine

## 2021-01-13 DIAGNOSIS — A048 Other specified bacterial intestinal infections: Secondary | ICD-10-CM

## 2021-01-13 NOTE — Assessment & Plan Note (Signed)
Along with his sponsor, we reviewed the appropriate regimen for his H. pylori infection.  He and his sponsor were able to repeat back the appropriate medication course. -Plan to begin treatment following Ramadan -Scheduled for follow-up H. pylori breath test at the end of June roughly 6 weeks following completion of his treatment -He was advised that it is okay for him to use his PPI even after he finishes his H. pylori treatment.  He was told to discontinue his PPI 2 weeks prior to his retesting

## 2021-01-13 NOTE — Progress Notes (Signed)
[facility] Telemedicine Visit  Patient consented to have virtual visit and was identified by name and date of birth. Method of visit: Video was attempted but was interrupted: <50% of visit completed via video  Encounter participants: Patient: Yaseen Gilberg - located at home Provider: [name] - located at [facility] Others (if applicable): Richie (sponsor)  Chief Complaint: Review of H. pylori medications  HPI:  H. pylori infection He has not planning to start his H. pylori treatment until Ramadan is finished but would like to over the medications today.  He has a 10-day blister pack, Pylera, in addition to his pantoprazole pills.  ROS: per HPI  Pertinent PMHx: Previously diagnosed with H. pylori infection  Exam:   Respiratory: Breathing comfortably during our conversation today.  Able to complete sentences without effort.  Assessment/Plan:  H. pylori infection Along with his sponsor, we reviewed the appropriate regimen for his H. pylori infection.  He and his sponsor were able to repeat back the appropriate medication course. -Plan to begin treatment following Ramadan -Scheduled for follow-up H. pylori breath test at the end of June roughly 6 weeks following completion of his treatment -He was advised that it is okay for him to use his PPI even after he finishes his H. pylori treatment.  He was told to discontinue his PPI 2 weeks prior to his retesting    Time spent during visit with patient: 15 minutes

## 2021-03-10 ENCOUNTER — Other Ambulatory Visit: Payer: Self-pay

## 2021-03-10 ENCOUNTER — Other Ambulatory Visit: Payer: Self-pay | Admitting: Family Medicine

## 2021-03-10 DIAGNOSIS — A048 Other specified bacterial intestinal infections: Secondary | ICD-10-CM

## 2021-03-11 LAB — H. PYLORI BREATH TEST: H pylori Breath Test: POSITIVE — AB

## 2021-03-15 ENCOUNTER — Other Ambulatory Visit: Payer: Self-pay | Admitting: Family Medicine

## 2021-03-15 DIAGNOSIS — A048 Other specified bacterial intestinal infections: Secondary | ICD-10-CM

## 2021-03-15 MED ORDER — PANTOPRAZOLE SODIUM 40 MG PO TBEC: 40.0000 mg | DELAYED_RELEASE_TABLET | Freq: Two times a day (BID) | ORAL | 0 refills | Status: DC

## 2021-03-15 MED ORDER — AMOXICILLIN 500 MG PO CAPS
1000.0000 mg | ORAL_CAPSULE | Freq: Two times a day (BID) | ORAL | 0 refills | Status: DC
Start: 2021-03-15 — End: 2021-03-24

## 2021-03-15 MED ORDER — LEVOFLOXACIN 500 MG PO TABS: 500.0000 mg | ORAL_TABLET | Freq: Every day | ORAL | 0 refills | Status: DC

## 2021-03-15 NOTE — Progress Notes (Signed)
After waiting on hold for 5 minutes for a Pashto interpreter, was not able to obtain a Pashto interpreter.  Instead, I spoke directly with his sponsor, Johnathan Harris due to.  I have previously been given permission to speak with Johnathan Harris about this issue.  I informed her that [name] had a positive retest for H. pylori.  As far as Johnathan Harris is aware, [name] was able to complete the previous Pylera treatment course correctly. As result, we need to move forward with a second treatment regimen for H. pylori.  The new treatment regimen will include 2 weeks of: Protonix, Augmentin and Levaquin.  After completing the antibiotic course, he should be retested.  We will plan for retesting in about 1 month.  He should avoid PPIs for 2 weeks prior to retesting.  If he is positive on retesting, this would mean that he has failed 2 treatment courses.  If he fails his second treatment course, I would like him to be seen by GI. -Protonix, Augmentin, Levaquin sent to pharmacy -Plan for repeat urea breath test in 4 weeks.  [name], MD

## 2021-03-17 ENCOUNTER — Other Ambulatory Visit: Payer: Self-pay

## 2021-03-17 ENCOUNTER — Ambulatory Visit (INDEPENDENT_AMBULATORY_CARE_PROVIDER_SITE_OTHER): Payer: Medicaid Other | Admitting: Infectious Diseases

## 2021-03-17 VITALS — BP 116/74 | HR 71 | Temp 98.3°F | Wt 211.5 lb

## 2021-03-17 DIAGNOSIS — A048 Other specified bacterial intestinal infections: Secondary | ICD-10-CM

## 2021-03-17 DIAGNOSIS — K7460 Unspecified cirrhosis of liver: Secondary | ICD-10-CM

## 2021-03-17 DIAGNOSIS — B181 Chronic viral hepatitis B without delta-agent: Secondary | ICD-10-CM

## 2021-03-17 NOTE — Progress Notes (Signed)
Patient: Johnathan Harris  DOB: 1986/05/15 MRN: [id number] PCP: Levin Erp, MD     Chief Complaint  Patient presents with   Follow-up     Patient Active Problem List   Diagnosis Date Noted   H. pylori infection 12/30/2020   Chronic GERD 09/29/2020   Chronic hepatitis B (HCC) 09/01/2020   Thrombocytopenia (HCC) 09/01/2020     Subjective:  Johnathan Harris is a 35 y.o. with chronic hepatitis b infection seen in April from Dr. Renold Don.   He his not sure why he is here. He states he got a call from [place] "the hospital" asking for an appointment. In review of his chart he is following with Dr. Renold Don for hep B which he has a pre-set appointment in October for follow up labs/monitoring. Has been working with PCP lately on H pylori and seems to have failed.   He states that the stomach medicine he took from Dr. Homero Fellers really helped him. He was given more stomach medicine recently that he has not picked up because, at first, he said it made him dizzy last time, but then states again later that it was going to cost him $400.  He is unsure of the names of the medications he took recently.  States his Medicaid is still active per his sponsor, however our records indicate it is not active.  Per chart review, he was given a 10-day course of Pylera (bismuth + tetracycline therapy) QID + pantoprazole April 27th. He did take and complete this treatment. He had a follow up breath test recently and still positive.  He does report that he is feeling better after he took that medication, but he still has constipation and has anterior stomach bloating discomfort. He has no reflux symptoms.   Regarding his hepatitis B he said that he recalls discussing with Dr. Renold Don that he may not be cured, may not need medications and that sometime it can be damaging to the liver/kidneys. Since that office visit, he has had a CT scan and Fibroscan for evaluation; both are suggestive of cirrhosis / compensated liver disease. Not  currently on medication.    Review of Systems  Constitutional:  Negative for chills and fever.  HENT:  Negative for tinnitus.   Eyes:  Negative for blurred vision and photophobia.  Respiratory:  Negative for cough and sputum production.   Cardiovascular:  Negative for chest pain.  Gastrointestinal:  Positive for abdominal pain (LUQ). Negative for diarrhea, nausea and vomiting.       Abdominal bloating  Genitourinary:  Negative for dysuria.  Skin:  Negative for rash.  Neurological:  Negative for headaches.     Outpatient Medications Prior to Visit  Medication Sig Dispense Refill   famotidine (PEPCID) 20 MG tablet Take 1 tablet (20 mg total) by mouth 2 (two) times daily. 60 tablet 1   pantoprazole (PROTONIX) 40 MG tablet Take 1 tablet (40 mg total) by mouth 2 (two) times daily. 20 tablet 0   amoxicillin (AMOXIL) 500 MG capsule Take 2 capsules (1,000 mg total) by mouth 2 (two) times daily for 14 days. (Patient not taking: Reported on 03/17/2021) 56 capsule 0   levofloxacin (LEVAQUIN) 500 MG tablet Take 1 tablet (500 mg total) by mouth daily. (Patient not taking: Reported on 03/17/2021) 14 tablet 0   No facility-administered medications prior to visit.     No Known Allergies  Social History   Tobacco Use   Smoking status: Never   Smokeless tobacco: Never  Substance Use Topics   Alcohol use: Never   Drug use: Never    No family history on file.  Objective:   Vitals:   03/17/21 1510  BP: 116/74  Pulse: 71  Temp: 98.3 F (36.8 C)  TempSrc: Oral  SpO2: 98%  Weight: 211 lb 8 oz (95.9 kg)   Body mass index is 32.16 kg/m.  Physical Exam  Lab Results: Lab Results  Component Value Date   WBC 4.2 12/11/2020   HGB 15.7 12/11/2020   HCT 46.9 12/11/2020   MCV 89.5 12/11/2020   PLT 106 (L) 12/11/2020    Lab Results  Component Value Date   CREATININE 0.83 12/11/2020   BUN 16 12/11/2020   NA 140 12/11/2020   K 4.3 12/11/2020   CL 106 12/11/2020   CO2 24 12/11/2020     Lab Results  Component Value Date   ALT 30 12/11/2020   AST 26 12/11/2020   ALKPHOS 69 08/25/2020   BILITOT 2.1 (H) 12/11/2020     Assessment & Plan:   Problem List Items Addressed This Visit       Unprioritized   H. pylori infection    Failed bismuth containing regimen - PCP working on FQ based regimen, however cost seems to be excessive. I told him I suspect his medicaid is not on but his sponsor has notified him it is. Will ask our pharmacy patient advocate to help investigate.        Relevant Medications   Tenofovir Alafenamide Fumarate (VEMLIDY) 25 MG TABS   Chronic hepatitis B (HCC) - Primary    Untreated Hepatitis B infection w/o other co-infections.  Labs from 4/08 indicate (+) eAg, DNA 800,000, AFP 2.6, LFTs normal with elevated bilirubin 2.1. No PT/INR. Creatinine 0.83, negative Hep c RNA and negative Delta virus pcr/Ab. CT and Fibroscan both indicate cirrhosis / compensated liver disease.   I requested him to repeat labs to calculate MELD/CP score today however due to cost / medicaid uncertainty he declined for now. Unless his PT/INR is completely off the charts he is likely Child Pugh A maybe B.   Given concern for cirrhosis I discussed with him that we need to start him on treatment for this with once daily Vemlidy. He is not sure this is what he and his other doctor talked about and is not sure he wants to start on medications yet since it sounded different.   I will D/W Dr. Renold Don after he gets back in the office and send a message to Dollie with further recommendations from him, but I am pretty confident he will recommend tx.        Relevant Medications   Tenofovir Alafenamide Fumarate (VEMLIDY) 25 MG TABS   Other Relevant Orders   COMPLETE METABOLIC PANEL WITH GFR   Protime-INR    We spent an hour in face to face time discussing the above in great detail, review of current medical problems and coordination of care.    Rexene Alberts, MSN, NP-C [facility] Pager: [phone] Office: [phone]  03/21/21  5:32 PM

## 2021-03-21 MED ORDER — VEMLIDY 25 MG PO TABS
1.0000 | ORAL_TABLET | Freq: Every day | ORAL | 11 refills | Status: DC
Start: 2021-03-21 — End: 2021-03-29

## 2021-03-21 NOTE — Assessment & Plan Note (Addendum)
Untreated Hepatitis B infection w/o other co-infections.  Labs from 4/08 indicate (+) eAg, DNA 800,000, AFP 2.6, LFTs normal with elevated bilirubin 2.1. No PT/INR. Creatinine 0.83, negative Hep c RNA and negative Delta virus pcr/Ab. CT and Fibroscan both indicate cirrhosis / compensated liver disease.   I requested him to repeat labs to calculate MELD/CP score today however due to cost / medicaid uncertainty he declined for now. Unless his PT/INR is completely off the charts he is likely Child Pugh A maybe B.   Given concern for cirrhosis I discussed with him that we need to start him on treatment for this with once daily Vemlidy. He is not sure this is what he and his other doctor talked about and is not sure he wants to start on medications yet since it sounded different.   I will D/W Dr. Renold Don after he gets back in the office and send a message to Jamarrius with further recommendations from him, but I am pretty confident he will recommend tx.

## 2021-03-21 NOTE — Assessment & Plan Note (Signed)
Failed bismuth containing regimen - PCP working on FQ based regimen, however cost seems to be excessive. I told him I suspect his medicaid is not on but his sponsor has notified him it is. Will ask our pharmacy patient advocate to help investigate.

## 2021-03-22 NOTE — Progress Notes (Signed)
Thanks Judeth Cornfield,   We been trying to get him back  I was surprised with his cirrhosis finding.  He definitely should get on treatment entecavir or tenofovir  However, i would also refer him to GI too to make sure we are not missing other causes of cirrhosis  Thank you

## 2021-03-22 NOTE — Progress Notes (Signed)
Oh and we may consider checking schistosomiasis serology to r/o that cause of liver cirrhosis.... Although he is postsinusoidal in picture so maybe not critical, but worth a check  Just something to consider...  Visceral leish is a consideration but liver ct not too remarkable

## 2021-03-23 ENCOUNTER — Other Ambulatory Visit (HOSPITAL_COMMUNITY): Payer: Self-pay

## 2021-03-24 ENCOUNTER — Other Ambulatory Visit: Payer: Self-pay

## 2021-03-24 MED ORDER — PANTOPRAZOLE SODIUM 40 MG PO TBEC: 40.0000 mg | DELAYED_RELEASE_TABLET | Freq: Two times a day (BID) | ORAL | 0 refills | Status: AC

## 2021-03-24 MED ORDER — AMOXICILLIN 500 MG PO CAPS: 1000.0000 mg | ORAL_CAPSULE | Freq: Two times a day (BID) | ORAL | 0 refills | Status: AC

## 2021-03-24 MED ORDER — LEVOFLOXACIN 500 MG PO TABS: 500.0000 mg | ORAL_TABLET | Freq: Every day | ORAL | 0 refills | Status: AC

## 2021-03-24 NOTE — Telephone Encounter (Signed)
Received phone call from patient's sponsor regarding medications. Due to previous PCP (Dr. Homero Fellers) not being registered with [facility], patient has been unable to pick up medications.   Routing to preceptor to resend for patient.   Veronda Prude, RN

## 2021-03-29 ENCOUNTER — Encounter: Payer: Self-pay | Admitting: Infectious Diseases

## 2021-03-29 MED ORDER — ENTECAVIR 0.5 MG PO TABS: 0.5000 mg | ORAL_TABLET | Freq: Every day | ORAL | 11 refills | Status: AC

## 2021-03-29 NOTE — Addendum Note (Signed)
Addended by: Blanchard Kelch on: 03/29/2021 03:37 PM   Modules accepted: Orders

## 2021-03-29 NOTE — Progress Notes (Addendum)
Will refer to GI to help with cirrhosis management and work up of other non-hepatitis b causes given his young age.   Patient message sent via mychart as he requested.   Entecavir 0.5 mg daily preferred for medicaid will change rx.

## 2021-03-29 NOTE — Addendum Note (Signed)
Addended by: Blanchard Kelch on: 03/29/2021 03:11 PM   Modules accepted: Orders

## 2021-05-04 ENCOUNTER — Ambulatory Visit: Payer: Self-pay | Admitting: Internal Medicine

## 2021-06-30 ENCOUNTER — Other Ambulatory Visit: Payer: Medicaid Other

## 2021-07-14 ENCOUNTER — Encounter: Payer: Medicaid Other | Admitting: Internal Medicine

## 2021-09-12 IMAGING — CT CT ABD-PELV W/ CM
2 of 4 series · 16 of 46 positions shown, 18 images · IV contrast (OMNIPAQUE 300)
Comparison: None

CLINICAL DATA: Thrombocytopenia, chronic hepatitis-B infection,
immigrant from [place], question portal hypertension,
granulomatous disease

EXAM:
CT ABDOMEN AND PELVIS WITH CONTRAST
TECHNIQUE: Multidetector CT imaging of the abdomen and pelvis was performed
using the standard protocol following bolus administration of
intravenous contrast. Sagittal and coronal MPR images reconstructed
from axial data set.
CONTRAST:  100mL OMNIPAQUE IOHEXOL 300 MG/ML SOLN IV. No oral
contrast administered.

[Series 2: axial st · axial · 0.83mm/px · z∈[+1081,+1536]mm · 13 of 105 slices shown, 15 images]
[im 7/105  soft-tissue]
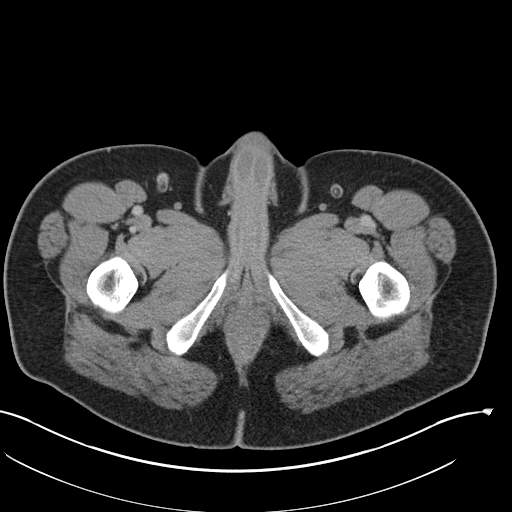
[im 7/105  bone]
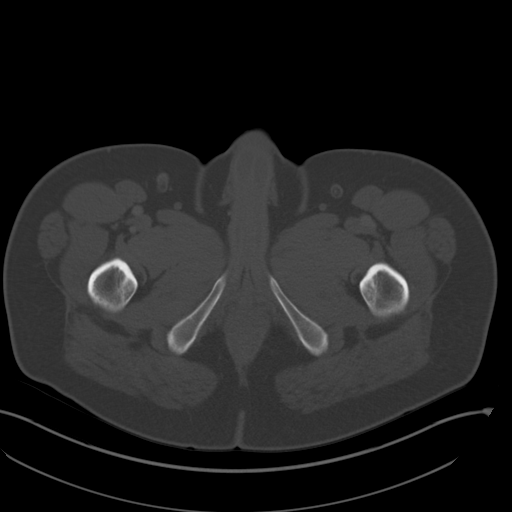
[im 13/105  soft-tissue]
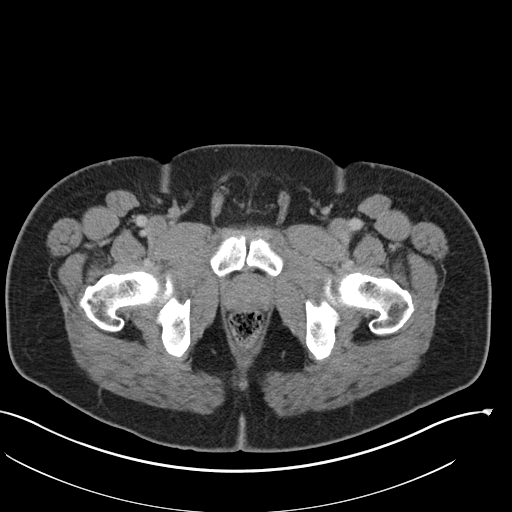
[im 25/105  soft-tissue]
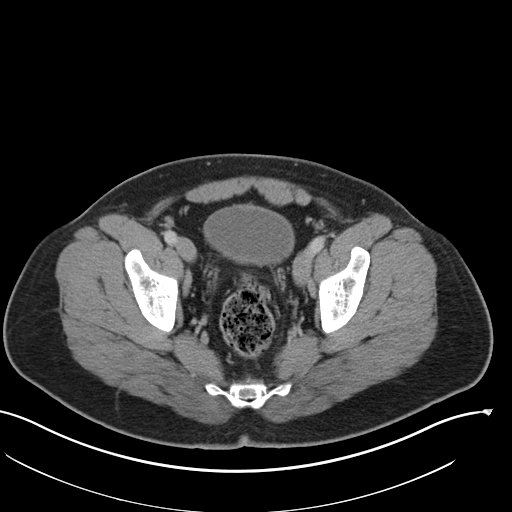
[im 31/105  soft-tissue]
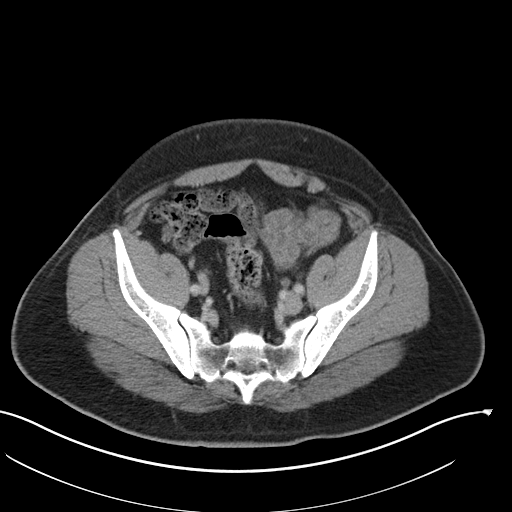
[im 37/105  soft-tissue]
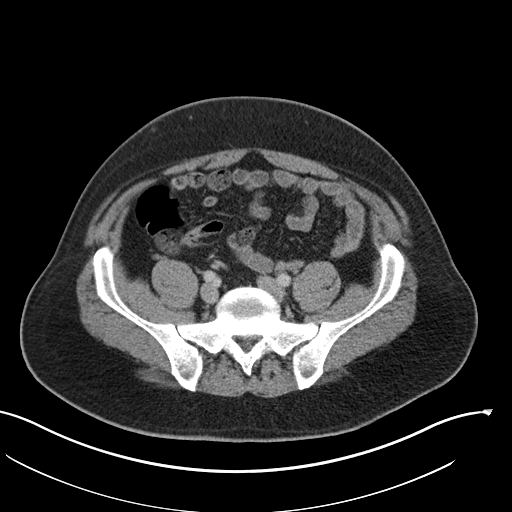
[im 43/105  soft-tissue]
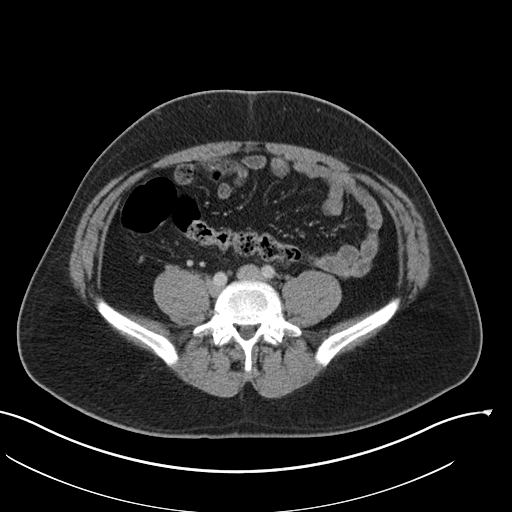
[im 56/105  soft-tissue]
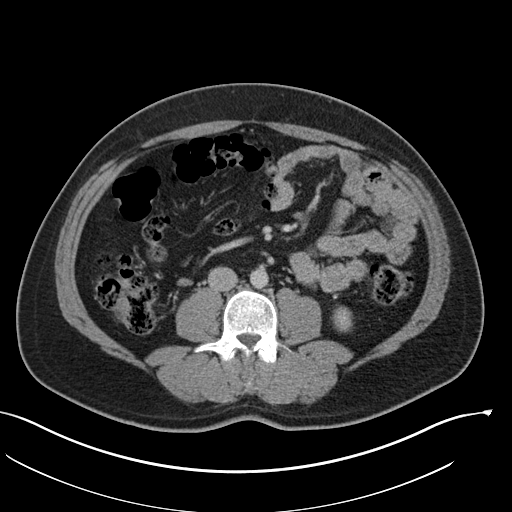
[im 62/105  soft-tissue]
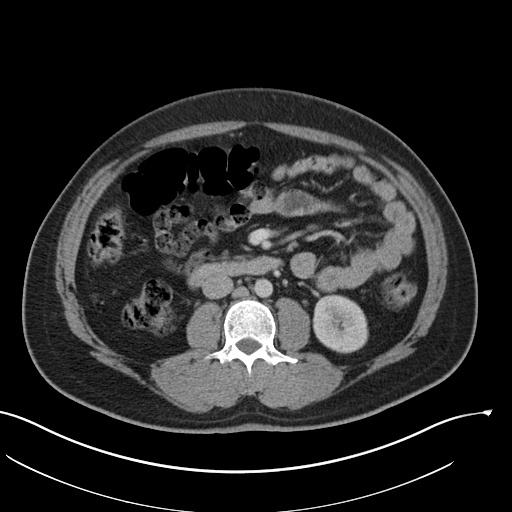
[im 68/105  soft-tissue]
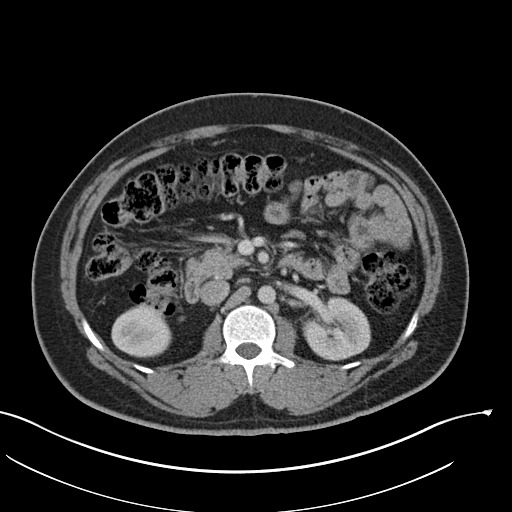
[im 68/105  bone]
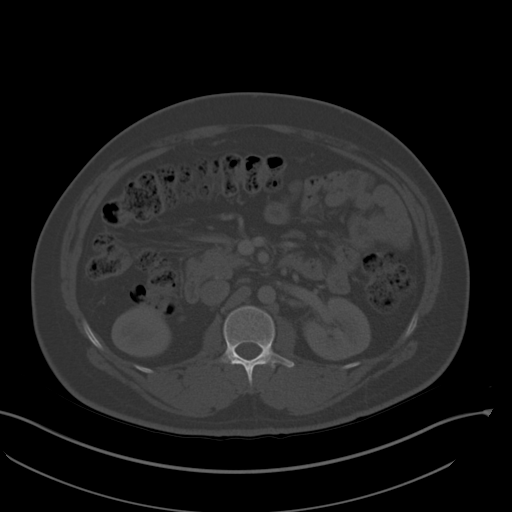
[im 74/105  soft-tissue]
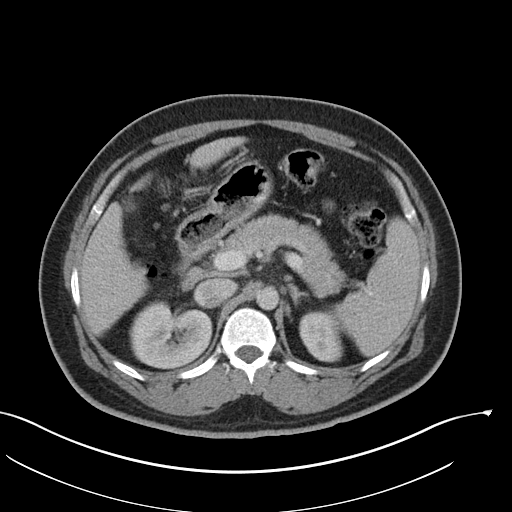
[im 80/105  soft-tissue]
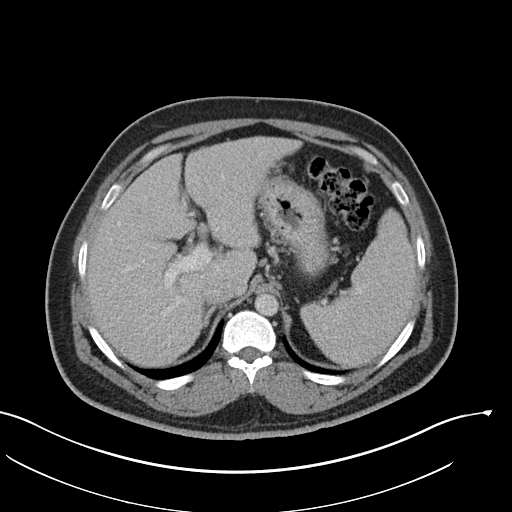
[im 92/105  soft-tissue]
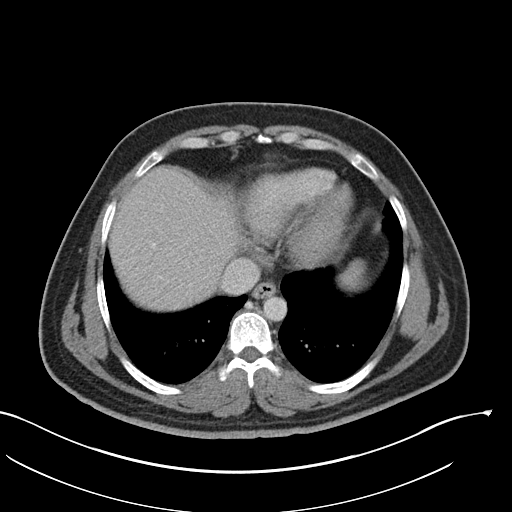
[im 98/105  soft-tissue]
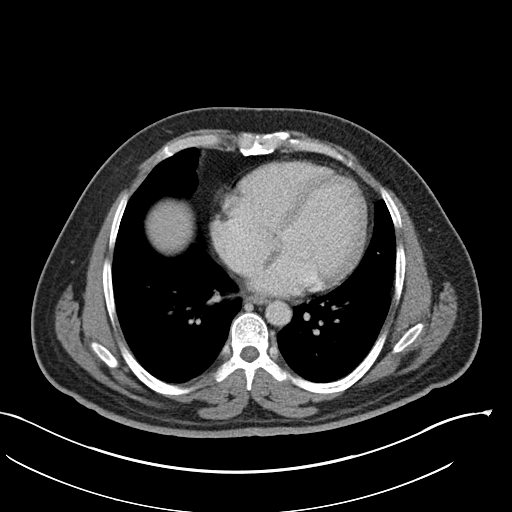

[Series 5: coronal st · coronal · 0.82mm/px · 3 of 160 slices shown]
[im 54/160  soft-tissue]
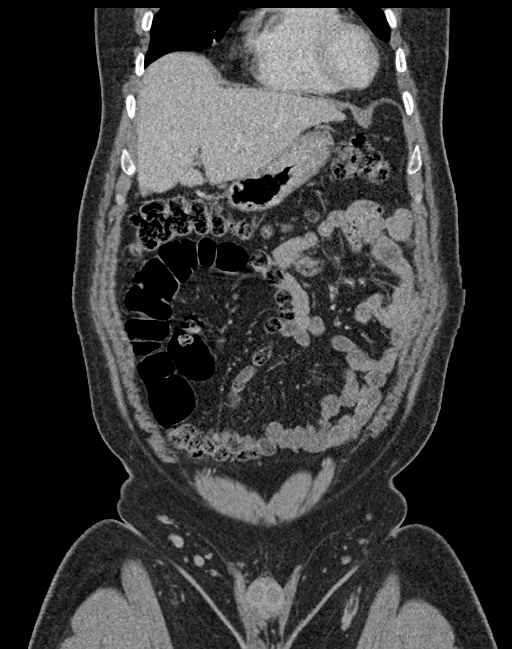
[im 71/160  soft-tissue]
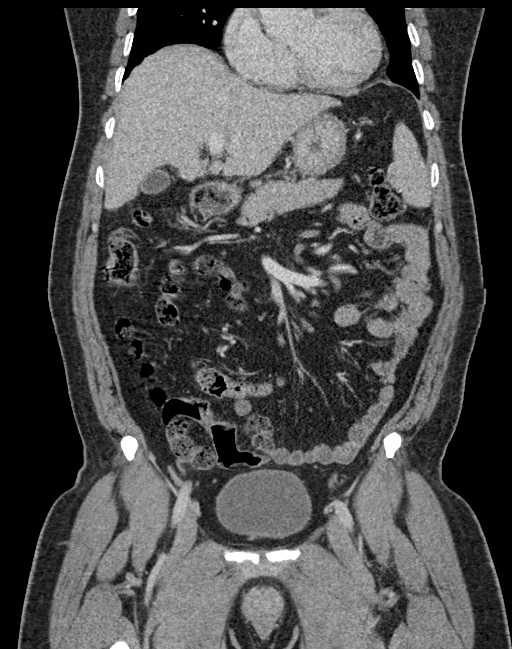
[im 89/160  soft-tissue]
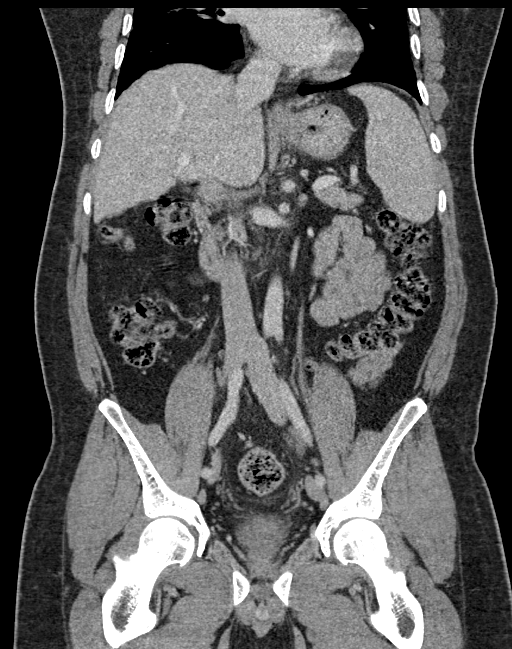

[16 of 46 positions shown; findings below may reference images not displayed]

FINDINGS: Lower chest: Lung bases clear

Hepatobiliary: Subtle nodularity of hepatic contour suggesting
cirrhosis. No focal hepatic mass. Gallbladder unremarkable.

Pancreas: Normal appearance

Spleen: Normal appearance, measuring 13.7 x 5.4 x 11.3 cm (volume =
440 cm^3), upper normal size

Adrenals/Urinary Tract: Adrenal glands, kidneys, ureters, and
bladder normal appearance

Stomach/Bowel: Normal appendix. Stomach decompressed. Bowel loops
unremarkable.

Vascular/Lymphatic: Vascular structures patent. Aorta normal
caliber. No adenopathy.

Reproductive: Prostate gland and seminal vesicles unremarkable

Other: No free air or free fluid. No hernia or inflammatory process.

Musculoskeletal: Unremarkable
IMPRESSION: Upper normal size of spleen.

Subtle nodularity of liver suggesting cirrhosis.

Remainder of exam unremarkable.

## 2023-06-12 ENCOUNTER — Encounter (HOSPITAL_COMMUNITY): Payer: Self-pay

## 2023-06-12 ENCOUNTER — Emergency Department (HOSPITAL_COMMUNITY): Payer: BC Managed Care – PPO

## 2023-06-12 ENCOUNTER — Emergency Department (HOSPITAL_COMMUNITY)
Admission: EM | Admit: 2023-06-12 | Discharge: 2023-06-12 | Disposition: A | Discharge status: Home / Self Care | Payer: BC Managed Care – PPO | Attending: Emergency Medicine | Admitting: Emergency Medicine

## 2023-06-12 ENCOUNTER — Other Ambulatory Visit: Payer: Self-pay

## 2023-06-12 DIAGNOSIS — R0789 Other chest pain: Secondary | ICD-10-CM | POA: Insufficient documentation

## 2023-06-12 DIAGNOSIS — Y92410 Unspecified street and highway as the place of occurrence of the external cause: Secondary | ICD-10-CM | POA: Insufficient documentation

## 2023-06-12 MED ORDER — IBUPROFEN 600 MG PO TABS: 600.0000 mg | ORAL_TABLET | Freq: Four times a day (QID) | ORAL | 0 refills | Status: DC | PRN

## 2023-06-12 MED ORDER — KETOROLAC TROMETHAMINE 30 MG/ML IJ SOLN
30.0000 mg | Freq: Once | INTRAMUSCULAR | Status: AC
  Administered 2023-06-12: 30 mg via INTRAMUSCULAR
  Filled 2023-06-12: qty 1
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
## 2024-01-30 ENCOUNTER — Other Ambulatory Visit: Payer: Self-pay

## 2024-01-30 ENCOUNTER — Ambulatory Visit
Admission: RE | Admit: 2024-01-30 | Discharge: 2024-01-30 | Disposition: A | Discharge status: Home / Self Care | Source: Ambulatory Visit | Attending: Family Medicine | Admitting: Family Medicine

## 2024-01-30 VITALS — BP 118/75 | HR 64 | Temp 98.2°F | Resp 16

## 2024-01-30 DIAGNOSIS — S86912A Strain of unspecified muscle(s) and tendon(s) at lower leg level, left leg, initial encounter: Secondary | ICD-10-CM

## 2024-01-30 MED ORDER — NAPROXEN 375 MG PO TABS: 375.0000 mg | ORAL_TABLET | Freq: Two times a day (BID) | ORAL | 0 refills | Status: AC | PRN

## 2024-01-30 NOTE — Discharge Instructions (Addendum)
 Use the knee sleeve to help with swelling and support of the knee joint.  You may elevate and ice as needed.  You may take naproxen twice daily as needed for pain and swelling.  Please follow-up with your PCP if your symptoms do not improve.  Please go to the ER for any worsening symptoms.  Hope you feel better soon!

## 2024-01-30 NOTE — ED Triage Notes (Signed)
 Pt c/o pain in let [id number]. Pt states he walks 12 hours a day for work. Pt walked well to exam room.

## 2024-01-30 NOTE — ED Provider Notes (Signed)
 [facility]    CSN: [id number] Arrival date & time: 01/30/24  1542      History   Chief Complaint Chief Complaint  Patient presents with   Leg Pain    Left knee 1 week pain - Entered by patient    HPI Johnathan Harris is a 38 y.o. male presents for knee pain.  Patient reports 1 week of an intermittent medial left knee pain.  States it began after walking long distances at work.  He states it swells sometimes but denies any bruising, numbness/tingling/weakness.  He has pain at rest and with weightbearing.  Denies any injury such as fall.  No history of left knee injuries or surgeries in the past.  He has been taking Tylenol with some improvement.  No other concerns at this time.   Leg Pain   History reviewed. No pertinent past medical history.  Patient Active Problem List   Diagnosis Date Noted   H. pylori infection 12/30/2020   Chronic GERD 09/29/2020   Chronic hepatitis B (HCC) 09/01/2020   Thrombocytopenia (HCC) 09/01/2020    History reviewed. No pertinent surgical history.     Home Medications    Prior to Admission medications   Medication Sig Start Date End Date Taking? Authorizing Provider  naproxen (NAPROSYN) 375 MG tablet Take 1 tablet (375 mg total) by mouth 2 (two) times daily as needed. 01/30/24  Yes Milynn Quirion, Jodi R, NP  entecavir  (BARACLUDE ) 0.5 MG tablet Take 1 tablet (0.5 mg total) by mouth daily. 03/29/21   Orson Blalock, NP  famotidine  (PEPCID ) 20 MG tablet Take 1 tablet (20 mg total) by mouth 2 (two) times daily. 09/29/20   Jovita Nipper, MD  levofloxacin  (LEVAQUIN ) 500 MG tablet Take 1 tablet (500 mg total) by mouth daily. 03/24/21   Charmel Cooter, MD  pantoprazole  (PROTONIX ) 40 MG tablet Take 1 tablet (40 mg total) by mouth 2 (two) times daily. 03/24/21   Charmel Cooter, MD    Family History History reviewed. No pertinent family history.  Social History Social History   Tobacco Use   Smoking status: Never   Smokeless tobacco: Never   Substance Use Topics   Alcohol use: Never   Drug use: Never     Allergies   Patient has no known allergies.   Review of Systems Review of Systems  Musculoskeletal:        Left knee pain     Physical Exam Triage Vital Signs ED Triage Vitals  Encounter Vitals Group     BP 01/30/24 1612 118/75     Systolic BP Percentile --      Diastolic BP Percentile --      Pulse Rate 01/30/24 1612 64     Resp 01/30/24 1612 16     Temp 01/30/24 1612 98.2 F (36.8 C)     Temp Source 01/30/24 1612 Oral     SpO2 01/30/24 1612 95 %     Weight --      Height --      Head Circumference --      Peak Flow --      Pain Score 01/30/24 1609 8     Pain Loc --      Pain Education --      Exclude from Growth Chart --    No data found.  Updated Vital Signs BP 118/75   Pulse 64   Temp 98.2 F (36.8 C) (Oral)   Resp 16   SpO2 95%  Visual Acuity Right Eye Distance:   Left Eye Distance:   Bilateral Distance:    Right Eye Near:   Left Eye Near:    Bilateral Near:     Physical Exam Vitals and nursing note reviewed.  Constitutional:      General: He is not in acute distress.    Appearance: Normal appearance. He is not ill-appearing.  HENT:     Head: Normocephalic and atraumatic.  Eyes:     Pupils: Pupils are equal, round, and reactive to light.  Cardiovascular:     Rate and Rhythm: Normal rate.  Pulmonary:     Effort: Pulmonary effort is normal.  Musculoskeletal:     Left knee: No swelling, deformity, effusion, erythema, ecchymosis, lacerations or bony tenderness. Normal range of motion. Tenderness present over the medial joint line. No lateral joint line or patellar tendon tenderness. Normal alignment and normal patellar mobility. Normal pulse.     Comments: Positive varus stress test.  Strength 5 out of 5 bilateral lower extremities  Skin:    General: Skin is warm and dry.  Neurological:     General: No focal deficit present.     Mental Status: He is alert and oriented to  person, place, and time.  Psychiatric:        Mood and Affect: Mood normal.        Behavior: Behavior normal.      UC Treatments / Results  Labs (all labs ordered are listed, but only abnormal results are displayed) Labs Reviewed - No data to display  EKG   Radiology No results found.  Procedures Procedures (including critical care time)  Medications Ordered in UC Medications - No data to display  Initial Impression / Assessment and Plan / UC Course  I have reviewed the triage vital signs and the nursing notes.  Pertinent labs & imaging results that were available during my care of the patient were reviewed by me and considered in my medical decision making (see chart for details).     Reviewed exam and symptoms with patient.  No red flags.  Discussed likely knee strain.  Defer imaging as no injury.  Discussed RICE therapy and patient fitted with knee sleeve.  Will do trial of naproxen twice daily.  Discussed rest and following up with PCP if symptoms do not improve.  ER precautions reviewed and patient verbalized understanding. Final Clinical Impressions(s) / UC Diagnoses   Final diagnoses:  Knee strain, left, initial encounter     Discharge Instructions      Use the knee sleeve to help with swelling and support of the knee joint.  You may elevate and ice as needed.  You may take naproxen twice daily as needed for pain and swelling.  Please follow-up with your PCP if your symptoms do not improve.  Please go to the ER for any worsening symptoms.  Hope you feel better soon!   ED Prescriptions     Medication Sig Dispense Auth. Provider   naproxen (NAPROSYN) 375 MG tablet Take 1 tablet (375 mg total) by mouth 2 (two) times daily as needed. 14 tablet Sanaia Jasso, Jodi R, NP      PDMP not reviewed this encounter.   Alleen Arbour, NP 01/30/24 1630

## 2024-04-16 ENCOUNTER — Telehealth: Payer: Self-pay | Admitting: Infectious Diseases

## 2024-04-16 ENCOUNTER — Encounter: Payer: Self-pay | Admitting: Student

## 2024-04-16 ENCOUNTER — Ambulatory Visit (INDEPENDENT_AMBULATORY_CARE_PROVIDER_SITE_OTHER): Admitting: Student

## 2024-04-16 VITALS — BP 101/65 | HR 70 | Wt 212.2 lb

## 2024-04-16 DIAGNOSIS — K7469 Other cirrhosis of liver: Secondary | ICD-10-CM

## 2024-04-16 DIAGNOSIS — A048 Other specified bacterial intestinal infections: Secondary | ICD-10-CM

## 2024-04-16 DIAGNOSIS — B181 Chronic viral hepatitis B without delta-agent: Secondary | ICD-10-CM | POA: Diagnosis not present

## 2024-04-16 NOTE — Telephone Encounter (Signed)
 Ole's sponsor, Mickey Dew, LVM to schedule an appt for the patient, per his request. I returned the call and LVM. Mickey requested to help with scheduling due to the language barrier. [phone]

## 2024-04-16 NOTE — Progress Notes (Signed)
    SUBJECTIVE:   CHIEF COMPLAINT / HPI:   Knee pain Currently taking Naproxen , as needed. Recently saw orthopedic surgery.  They were hesitant to do further treatments 2/2 his liver cirrhosis  (particularly because he has not been seen in several years and did not complete treatment for hepatitis B).  We discussed need for reestablishing care today in our clinic, and more pressing matters related to his cirrhosis.  Patient is agreeable to follow-up about knee pain at a later time.  Liver cirrhosis No abdominal pain, no swelling of extremities or ascites. No alcohol use. Denies jaundice.  Previous lab work, CT and FibroScan indicating cirrhosis/compensated liver disease.  He has chronic hepatitis B infection, which approximately 3 years ago infectious disease recommended treatment for-unfortunately he was lost to follow-up. [name] called his infectious disease doctor today 7/29, to schedule a follow-up. Appears he called [facility] clinic by mistake, and scheduled with us . However this is fortuitous as he needs to be seen by our clinic as well.  OBJECTIVE:   BP 101/65   Pulse 70   Wt 212 lb 3.2 oz (96.3 kg)   SpO2 97%   BMI 38.81 kg/m    General: NAD, pleasant Cardio: RRR, no MRG. Respiratory: CTAB, normal wob on RA GI: Abdomen is soft, not tender, not distended. BS present Skin: Warm and dry. Mild skin jaunced, no scleral icterus.   ASSESSMENT/PLAN:   Assessment & Plan Chronic hepatitis B (HCC) - Previously seen by infectious disease in 2022 - Provided ID phone number to call and schedule appointment Other cirrhosis of liver (HCC) Suspected 2/2 chronic hepatitis B infection.  Stable vitals today, no indication for ED evaluation or hospitalization. -Update labs for staging and risk stratification -RUQ U/S -Referral to Hepatology H. pylori infection -Failed treatment in the past -Unclear if he had another round of treatment. No test of cure. -Breath test today -Consider  retreatment  Follow-up recommendations Follow-up in 2-4 weeks Follow-up knee pain, already seeing orthopedic surgery.  Gladis Church, DO [facility]

## 2024-04-16 NOTE — Patient Instructions (Addendum)
 It was great to see you! Thank you for allowing me to participate in your care!   I recommend that you always bring your medications to each appointment as this makes it easy to ensure we are on the correct medications and helps us  not miss when refills are needed.  Our plans for today:  - I have sent a referral to hepatology, to be seen about your liver cirrhosis. - Please call back your infectious disease doctor and schedule an appointment.  Their number is [phone] -We have ordered a ultrasound of your liver, our nurse will let you know the time and date prior to you leaving today. - We are checking some labs today, I will call you if they are abnormal will send you a MyChart message or a letter if they are normal.  If you do not hear about your labs in the next 2 weeks please let us  know.  Take care and seek immediate care sooner if you develop any concerns. Please remember to show up 15 minutes before your scheduled appointment time!  Gladis Church, DO [facility]

## 2024-04-16 NOTE — Assessment & Plan Note (Signed)
-  Failed treatment in the past -Unclear if he had another round of treatment. No test of cure. -Breath test today -Consider retreatment

## 2024-04-16 NOTE — Assessment & Plan Note (Addendum)
-   Previously seen by infectious disease in 2022 - Provided ID phone number to call and schedule appointment

## 2024-04-17 ENCOUNTER — Telehealth: Payer: Self-pay

## 2024-04-17 LAB — COMPREHENSIVE METABOLIC PANEL WITH GFR
ALT: 49 IU/L — ABNORMAL HIGH (ref 0–44)
AST: 38 IU/L (ref 0–40)
Albumin: 4.4 g/dL (ref 4.1–5.1)
Alkaline Phosphatase: 78 IU/L (ref 44–121)
BUN/Creatinine Ratio: 18 (ref 9–20)
BUN: 17 mg/dL (ref 6–20)
Bilirubin Total: 1.8 mg/dL — ABNORMAL HIGH (ref 0.0–1.2)
CO2: 22 mmol/L (ref 20–29)
Calcium: 9.2 mg/dL (ref 8.7–10.2)
Chloride: 102 mmol/L (ref 96–106)
Creatinine, Ser: 0.95 mg/dL (ref 0.76–1.27)
Globulin, Total: 2.3 g/dL (ref 1.5–4.5)
Glucose: 86 mg/dL (ref 70–99)
Potassium: 4.2 mmol/L (ref 3.5–5.2)
Sodium: 139 mmol/L (ref 134–144)
Total Protein: 6.7 g/dL (ref 6.0–8.5)
eGFR: 106 mL/min/1.73 (ref >59)

## 2024-04-17 LAB — LIPID PANEL
Chol/HDL Ratio: 3.5 ratio (ref 0.0–5.0)
Cholesterol, Total: 143 mg/dL (ref 100–199)
HDL: 41 mg/dL (ref >39)
LDL Chol Calc (NIH): 85 mg/dL (ref 0–99)
Triglycerides: 92 mg/dL (ref 0–149)
VLDL Cholesterol Cal: 17 mg/dL (ref 5–40)

## 2024-04-17 LAB — CBC WITH DIFFERENTIAL/PLATELET
Basophils Absolute: 0.1 x10E3/uL (ref 0.0–0.2)
Basos: 1 %
EOS (ABSOLUTE): 0.1 x10E3/uL (ref 0.0–0.4)
Eos: 3 %
Hematocrit: 47.4 % (ref 37.5–51.0)
Hemoglobin: 15.8 g/dL (ref 13.0–17.7)
Immature Grans (Abs): 0 x10E3/uL (ref 0.0–0.1)
Immature Granulocytes: 0 %
Lymphocytes Absolute: 1.8 x10E3/uL (ref 0.7–3.1)
Lymphs: 37 %
MCH: 31.2 pg (ref 26.6–33.0)
MCHC: 33.3 g/dL (ref 31.5–35.7)
MCV: 94 fL (ref 79–97)
Monocytes Absolute: 0.3 x10E3/uL (ref 0.1–0.9)
Monocytes: 7 %
Neutrophils Absolute: 2.5 x10E3/uL (ref 1.4–7.0)
Neutrophils: 52 %
Platelets: 104 x10E3/uL — ABNORMAL LOW (ref 150–450)
RBC: 5.06 x10E6/uL (ref 4.14–5.80)
RDW: 11.7 % (ref 11.6–15.4)
WBC: 4.8 x10E3/uL (ref 3.4–10.8)

## 2024-04-17 LAB — PROTIME-INR
INR: 1.2 (ref 0.9–1.2)
Prothrombin Time: 12.6 s — ABNORMAL HIGH (ref 9.1–12.0)

## 2024-04-17 NOTE — Telephone Encounter (Signed)
 Per Corean Fireman, NP, patient does not need ID follow up if he has been referred to hepatology.   Called patient and explained that hepatology will be able to handle all of his hepatitis care and canceled ID appointment.   Patient verbalized understanding and has no further questions.   [employer] [phone]  Duwaine Lowe, BSN, RN

## 2024-04-18 ENCOUNTER — Ambulatory Visit: Payer: Self-pay | Admitting: Student

## 2024-04-18 LAB — H. PYLORI BREATH TEST: H pylori Breath Test: NEGATIVE

## 2024-04-18 NOTE — Telephone Encounter (Signed)
 Child Pugh A. Pending new U/S

## 2024-04-22 ENCOUNTER — Other Ambulatory Visit (HOSPITAL_COMMUNITY)

## 2024-04-25 ENCOUNTER — Other Ambulatory Visit (HOSPITAL_COMMUNITY)

## 2024-04-26 ENCOUNTER — Ambulatory Visit (HOSPITAL_COMMUNITY)
Admission: RE | Admit: 2024-04-26 | Discharge: 2024-04-26 | Disposition: A | Discharge status: Home / Self Care | Source: Ambulatory Visit | Attending: Family Medicine | Admitting: Family Medicine

## 2024-04-26 DIAGNOSIS — B181 Chronic viral hepatitis B without delta-agent: Secondary | ICD-10-CM | POA: Insufficient documentation

## 2024-04-30 ENCOUNTER — Ambulatory Visit: Admitting: Infectious Diseases
